# Patient Record
Sex: Male | Born: 1940 | Race: White | Hispanic: No | Marital: Married | Smoking: Never smoker
Health system: Southern US, Community
[De-identification: ages and names within clinical notes are randomized; demographics above are authoritative.]

## PROBLEM LIST (undated history)

## (undated) DIAGNOSIS — N200 Calculus of kidney: Secondary | ICD-10-CM

## (undated) DIAGNOSIS — K219 Gastro-esophageal reflux disease without esophagitis: Secondary | ICD-10-CM

## (undated) DIAGNOSIS — H9190 Unspecified hearing loss, unspecified ear: Secondary | ICD-10-CM

## (undated) DIAGNOSIS — Z86718 Personal history of other venous thrombosis and embolism: Secondary | ICD-10-CM

## (undated) DIAGNOSIS — R002 Palpitations: Secondary | ICD-10-CM

## (undated) DIAGNOSIS — Z973 Presence of spectacles and contact lenses: Secondary | ICD-10-CM

## (undated) HISTORY — PX: KIDNEY STONE SURGERY: SHX686

## (undated) HISTORY — PX: LITHOTRIPSY: SUR834

## (undated) HISTORY — PX: NASAL SINUS SURGERY: SHX719

## (undated) HISTORY — PX: UMBILICAL HERNIA REPAIR: SHX196

## (undated) HISTORY — PX: KNEE SURGERY: SHX244

---

## 2014-06-11 ENCOUNTER — Emergency Department (HOSPITAL_BASED_OUTPATIENT_CLINIC_OR_DEPARTMENT_OTHER)
Admission: EM | Admit: 2014-06-11 | Discharge: 2014-06-11 | Disposition: A | Discharge status: Home / Self Care | Payer: Medicare Other | Attending: Emergency Medicine | Admitting: Emergency Medicine

## 2014-06-11 ENCOUNTER — Emergency Department (HOSPITAL_BASED_OUTPATIENT_CLINIC_OR_DEPARTMENT_OTHER): Payer: Medicare Other

## 2014-06-11 ENCOUNTER — Encounter (HOSPITAL_BASED_OUTPATIENT_CLINIC_OR_DEPARTMENT_OTHER): Payer: Self-pay

## 2014-06-11 DIAGNOSIS — Z79899 Other long term (current) drug therapy: Secondary | ICD-10-CM | POA: Diagnosis not present

## 2014-06-11 DIAGNOSIS — I82409 Acute embolism and thrombosis of unspecified deep veins of unspecified lower extremity: Secondary | ICD-10-CM | POA: Insufficient documentation

## 2014-06-11 DIAGNOSIS — M25562 Pain in left knee: Secondary | ICD-10-CM | POA: Diagnosis not present

## 2014-06-11 DIAGNOSIS — Z87442 Personal history of urinary calculi: Secondary | ICD-10-CM | POA: Insufficient documentation

## 2014-06-11 DIAGNOSIS — I82402 Acute embolism and thrombosis of unspecified deep veins of left lower extremity: Secondary | ICD-10-CM

## 2014-06-11 DIAGNOSIS — M25569 Pain in unspecified knee: Secondary | ICD-10-CM

## 2014-06-11 DIAGNOSIS — K219 Gastro-esophageal reflux disease without esophagitis: Secondary | ICD-10-CM | POA: Diagnosis not present

## 2014-06-11 HISTORY — DX: Calculus of kidney: N20.0

## 2014-06-11 HISTORY — DX: Gastro-esophageal reflux disease without esophagitis: K21.9

## 2014-06-11 LAB — CBC WITH DIFFERENTIAL/PLATELET
Basophils Absolute: 0 10*3/uL (ref 0.0–0.1)
Basophils Relative: 0 % (ref 0–1)
EOS ABS: 0.2 10*3/uL (ref 0.0–0.7)
Eosinophils Relative: 2 % (ref 0–5)
HCT: 41.1 % (ref 39.0–52.0)
HEMOGLOBIN: 14 g/dL (ref 13.0–17.0)
Lymphocytes Relative: 15 % (ref 12–46)
Lymphs Abs: 1.4 10*3/uL (ref 0.7–4.0)
MCH: 30.2 pg (ref 26.0–34.0)
MCHC: 34.1 g/dL (ref 30.0–36.0)
MCV: 88.8 fL (ref 78.0–100.0)
MONOS PCT: 7 % (ref 3–12)
Monocytes Absolute: 0.6 10*3/uL (ref 0.1–1.0)
NEUTROS ABS: 6.7 10*3/uL (ref 1.7–7.7)
Neutrophils Relative %: 76 % (ref 43–77)
Platelets: 192 10*3/uL (ref 150–400)
RBC: 4.63 MIL/uL (ref 4.22–5.81)
RDW: 13.5 % (ref 11.5–15.5)
WBC: 8.9 10*3/uL (ref 4.0–10.5)

## 2014-06-11 LAB — COMPREHENSIVE METABOLIC PANEL
ALT: 14 U/L (ref 0–53)
ANION GAP: 13 (ref 5–15)
AST: 16 U/L (ref 0–37)
Albumin: 3.8 g/dL (ref 3.5–5.2)
Alkaline Phosphatase: 81 U/L (ref 39–117)
BILIRUBIN TOTAL: 0.6 mg/dL (ref 0.3–1.2)
BUN: 11 mg/dL (ref 6–23)
CHLORIDE: 101 meq/L (ref 96–112)
CO2: 26 mEq/L (ref 19–32)
CREATININE: 0.7 mg/dL (ref 0.50–1.35)
Calcium: 9.2 mg/dL (ref 8.4–10.5)
GFR calc Af Amer: >90 mL/min (ref >90)
GFR calc non Af Amer: >90 mL/min (ref >90)
Glucose, Bld: 105 mg/dL — ABNORMAL HIGH (ref 70–99)
Potassium: 4.3 mEq/L (ref 3.7–5.3)
Sodium: 140 mEq/L (ref 137–147)
Total Protein: 7.4 g/dL (ref 6.0–8.3)

## 2014-06-11 LAB — PROTIME-INR
INR: 0.97 (ref 0.00–1.49)
Prothrombin Time: 12.9 seconds (ref 11.6–15.2)

## 2014-06-11 MED ORDER — RIVAROXABAN 15 MG PO TABS
15.0000 mg | ORAL_TABLET | Freq: Once | ORAL | Status: AC
  Administered 2014-06-11: 15 mg via ORAL
  Filled 2014-06-11: qty 1

## 2014-06-11 MED ORDER — XARELTO VTE STARTER PACK 15 & 20 MG PO TBPK: 15.0000 mg | ORAL_TABLET | ORAL | Status: DC

## 2014-06-11 NOTE — ED Notes (Signed)
MD at bedside. 

## 2014-06-11 NOTE — ED Provider Notes (Signed)
CSN: [id number]     Arrival date & time 06/11/14  1820 History  This chart was scribed for Glynn OctaveStephen Samson Ralph, MD by Evon Slackerrance Branch, ED Scribe. This patient was seen in room MH06/MH06 and the patient's care was started at 6:44 PM.     Chief Complaint  Patient presents with  . Knee Pain   Patient is a 73 y.o. male presenting with knee pain. The history is provided by the patient. No language interpreter was used.  Knee Pain Associated symptoms: no fever    HPI Comments: Robert PiccoloWilliam Beard is a 73 y.o. male who presents to the Emergency Department complaining of worsening left knee pain onset tonight while watching TV. Pt states that he just had the meniscus reapaired on the left knee 2 days ago. He states that the pain and swelling have gradually worsened about 2 hours PTA. He states that the knee began to spasm and lasted for about 1 hour and 15 minutes. He describes the spasm as " hard, hot and painful." Daughter states that the leg feels more warm and slightly more swollen. He states that the knee has resolved back to normal swelling and pain from the surgery. He states he has taken oxycodone that has provided some relief to the pain. Pt states that he was not placed on strict restrictions and is able to bear weight and walk on the knee. Denies fever, vomiting, CP, SOB, abdominal pain.   Knee Surgeon Dr. Rolley SimsLangfitt    Past Medical History  Diagnosis Date  . GERD (gastroesophageal reflux disease)   . Kidney stone    Past Surgical History  Procedure Laterality Date  . Knee surgery    . Lithotripsy    . Kidney stone surgery     No family history on file. History  Substance Use Topics  . Smoking status: Never Smoker   . Smokeless tobacco: Not on file  . Alcohol Use: No    Review of Systems  Constitutional: Negative for fever.  Respiratory: Negative for shortness of breath.   Cardiovascular: Negative for chest pain.  Gastrointestinal: Negative for vomiting and abdominal pain.   Musculoskeletal: Positive for myalgias, arthralgias and gait problem.   A complete 10 system review of systems was obtained and all systems are negative except as noted in the HPI and PMH.     Allergies  Morphine and related  Home Medications   Prior to Admission medications   Medication Sig Start Date End Date Taking? Authorizing Provider  LANSOPRAZOLE PO Take by mouth.   Yes Historical Provider, MD  OXYCODONE HCL PO Take by mouth.   Yes Historical Provider, MD  XARELTO STARTER PACK 15 & 20 MG TBPK Take 15-20 mg by mouth as directed. Take as directed on package: Start with one 15mg  tablet by mouth twice a day with food. On Day 22, switch to one 20mg  tablet once a day with food. 06/11/14   Glynn OctaveStephen Malgorzata Albert, MD   Triage Vitals: BP 154/105 mmHg  Pulse 83  Temp(Src) 98.1 F (36.7 C) (Oral)  Resp 18  Ht 5\' 10"  (1.778 m)  Wt 210 lb (95.255 kg)  BMI 30.13 kg/m2  SpO2 99%  Physical Exam  Constitutional: He is oriented to person, place, and time. He appears well-developed and well-nourished. No distress.  HENT:  Head: Normocephalic and atraumatic.  Mouth/Throat: Oropharynx is clear and moist. No oropharyngeal exudate.  Eyes: Conjunctivae and EOM are normal. Pupils are equal, round, and reactive to light.  Neck: Normal range of motion.  Neck supple.  No meningismus.  Cardiovascular: Normal rate, regular rhythm, normal heart sounds and intact distal pulses.   No murmur heard. Pulmonary/Chest: Effort normal and breath sounds normal. No respiratory distress.  Abdominal: Soft. There is no tenderness. There is no rebound and no guarding.  Musculoskeletal: Normal range of motion. He exhibits edema and tenderness.  Left knee: moderate effusion, intact surgical incision x3, no cellulitis, flexion intact to about 60 degrees, extension intact, intact DT and DP pulses,  moderate calf swelling with trace erythema and compartment soft.    Neurological: He is alert and oriented to person, place,  and time. No cranial nerve deficit. He exhibits normal muscle tone. Coordination normal.  No ataxia on finger to nose bilaterally. No pronator drift. 5/5 strength throughout. CN 2-12 intact. Negative Romberg. Equal grip strength. Sensation intact. Gait is normal.   Skin: Skin is warm.  Psychiatric: He has a normal mood and affect. His behavior is normal.  Nursing note and vitals reviewed.   ED Course  Procedures (including critical care time) DIAGNOSTIC STUDIES: Oxygen Saturation is 99% on RA, normal by my interpretation.    COORDINATION OF CARE: 6:59 PM-Discussed treatment plan with pt at bedside and pt agreed to plan.     Labs Review Labs Reviewed  COMPREHENSIVE METABOLIC PANEL - Abnormal; Notable for the following:    Glucose, Bld 105 (*)    All other components within normal limits  CBC WITH DIFFERENTIAL  PROTIME-INR    Imaging Review [place] Venous Img Lower Unilateral Left  06/11/2014   CLINICAL DATA:  Left knee pain. Swelling and redness. Status post left meniscus repair 2 days ago.  EXAM: Left LOWER EXTREMITY VENOUS DOPPLER ULTRASOUND  TECHNIQUE: Gray-scale sonography with graded compression, as well as color Doppler and duplex ultrasound were performed to evaluate the lower extremity deep venous systems from the level of the common femoral vein and including the common femoral, femoral, profunda femoral, popliteal and calf veins including the posterior tibial, peroneal and gastrocnemius veins when visible. The superficial great saphenous vein was also interrogated. Spectral Doppler was utilized to evaluate flow at rest and with distal augmentation maneuvers in the common femoral, femoral and popliteal veins.  COMPARISON:  None.  FINDINGS: Left: Noncompressible, mildly expanded distal femoral vein and popliteal vein with absent color Doppler flow. Findings are consistent with thrombosis, essentially occlusive. Peroneal vein was not visualized, the posterior tibial vein is patent. The  mid and upper femoral vein, upper profunda femoral vein, saphenous femoral junction, and common femoral vein on the left are patent. Respiratory phasicity present.  Right:  The common femoral vein is normal.  IMPRESSION: Positive for deep venous thrombosis in the left distal femoral and popliteal veins.   Electronically Signed   By: Tiburcio PeaJonathan  Watts M.D.   On: 06/11/2014 20:56   Dg Knee Complete 4 Views Left  06/11/2014   CLINICAL DATA:  Pain and swelling status post recent arthroscopic surgery  EXAM: LEFT KNEE - COMPLETE 4+ VIEW  COMPARISON:  Left knee radiographs April 01, 2014 and left knee MRI May 13, 2014  FINDINGS: Frontal, lateral, and bilateral oblique views were obtained. There is a large joint effusion. No fracture or dislocation. Joint spaces appear intact. No erosive change.  IMPRESSION: Large joint effusion. No fracture or appreciable arthropathic change.   Electronically Signed   By: Bretta BangWilliam  Woodruff M.D.   On: 06/11/2014 19:41     EKG Interpretation None      MDM   Final diagnoses:  Knee pain  DVT (deep venous thrombosis), right  L knee and leg pain after arthoscopy 2 days ago.  No fever or vomiting. No erythema, able to flex and extend.  Doubt septic joint.  Large knee effusion on x-ray. Patient with flexion and extension intact. No overlying cellulitis. No fever. Doubt septic joint.  Discussed with on-call orthopedic surgeon Dr. Guilford Shi at [facility]. Who recommends patient follow up with his orthopedic surgeon tomorrow. Dr. Guilford Shi agrees no role for arthrocentesis in joint that is 2 days postoperative. Flexion and extension are intact. There is no fever.  Updated Dr. Guilford Shi of +DVT.  Patient denies SOB or Chest pain.  Dr. Guilford Shi states safe to start xarelto despite recent surgery. Cr and platelets normal.  D/w patient and family risks and benefits of xarelto. They are agreeable.  Starter pack given.  Follow up with Dr. Theodis Aguas tomorrow.  Return precautions  discussed.   I personally performed the services described in this documentation, which was scribed in my presence. The recorded information has been reviewed and is accurate.    Glynn Octave, MD 06/12/14 [phone]

## 2014-06-11 NOTE — Discharge Instructions (Signed)
Deep Vein Thrombosis Follow up with Dr. Rolley SimsLangfitt tomorrow. Return to the ED if you develop chest pain, SOB, or any other concerns. A deep vein thrombosis (DVT) is a blood clot that develops in the deep, larger veins of the leg, arm, or pelvis. These are more dangerous than clots that might form in veins near the surface of the body. A DVT can lead to serious and even life-threatening complications if the clot breaks off and travels in the bloodstream to the lungs.  A DVT can damage the valves in your leg veins so that instead of flowing upward, the blood pools in the lower leg. This is called post-thrombotic syndrome, and it can result in pain, swelling, discoloration, and sores on the leg. CAUSES Usually, several things contribute to the formation of blood clots. Contributing factors include:  The flow of blood slows down.  The inside of the vein is damaged in some way.  You have a condition that makes blood clot more easily. RISK FACTORS Some people are more likely than others to develop blood clots. Risk factors include:   Smoking.  Being overweight (obese).  Sitting or lying still for a long time. This includes long-distance travel, paralysis, or recovery from an illness or surgery. Other factors that increase risk are:   Older age, especially over 72 years of age.  Having a family history of blood clots or if you have already had a blot clot.  Having major or lengthy surgery. This is especially true for surgery on the hip, knee, or belly (abdomen). Hip surgery is particularly high risk.  Having a long, thin tube (catheter) placed inside a vein during a medical procedure.  Breaking a hip or leg.  Having cancer or cancer treatment.  Pregnancy and childbirth.  Hormone changes make the blood clot more easily during pregnancy.  The fetus puts pressure on the veins of the pelvis.  There is a risk of injury to veins during delivery or a caesarean delivery. The risk is highest  just after childbirth.  Medicines containing the male hormone estrogen. This includes birth control pills and hormone replacement therapy.  Other circulation or heart problems.  SIGNS AND SYMPTOMS When a clot forms, it can either partially or totally block the blood flow in that vein. Symptoms of a DVT can include:  Swelling of the leg or arm, especially if one side is much worse.  Warmth and redness of the leg or arm, especially if one side is much worse.  Pain in an arm or leg. If the clot is in the leg, symptoms may be more noticeable or worse when standing or walking. The symptoms of a DVT that has traveled to the lungs (pulmonary embolism, PE) usually start suddenly and include:  Shortness of breath.  Coughing.  Coughing up blood or blood-tinged mucus.  Chest pain. The chest pain is often worse with deep breaths.  Rapid heartbeat. Anyone with these symptoms should get emergency medical treatment right away. Do not wait to see if the symptoms will go away. Call your local emergency services (911 in the [place] if you have these symptoms. Do not drive yourself to the hospital. DIAGNOSIS If a DVT is suspected, your health care provider will take a full medical history and perform a physical exam. Tests that also may be required include:  Blood tests, including studies of the clotting properties of the blood.  Ultrasound to see if you have clots in your legs or lungs.  X-rays to show the  flow of blood when dye is injected into the veins (venogram).  Studies of your lungs if you have any chest symptoms. PREVENTION  Exercise the legs regularly. Take a brisk 30-minute walk every day.  Maintain a weight that is appropriate for your height.  Avoid sitting or lying in bed for long periods of time without moving your legs.  Women, particularly those over the age of 35 years, should consider the risks and benefits of taking estrogen medicines, including birth control pills.  Do  not smoke, especially if you take estrogen medicines.  Long-distance travel can increase your risk of DVT. You should exercise your legs by walking or pumping the muscles every hour.  Many of the risk factors above relate to situations that exist with hospitalization, either for illness, injury, or elective surgery. Prevention may include medical and nonmedical measures.  Your health care provider will assess you for the need for venous thromboembolism prevention when you are admitted to the hospital. If you are having surgery, your surgeon will assess you the day of or day after surgery. TREATMENT Once identified, a DVT can be treated. It can also be prevented in some circumstances. Once you have had a DVT, you may be at increased risk for a DVT in the future. The most common treatment for DVT is blood-thinning (anticoagulant) medicine, which reduces the blood's tendency to clot. Anticoagulants can stop new blood clots from forming and stop old clots from growing. They cannot dissolve existing clots. Your body does this by itself over time. Anticoagulants can be given by mouth, through an IV tube, or by injection. Your health care provider will determine the best program for you. Other medicines or treatments that may be used are:  Heparin or related medicines (low molecular weight heparin) are often the first treatment for a blood clot. They act quickly. However, they cannot be taken orally and must be given either in shot form or by IV tube.  Heparin can cause a fall in a component of blood that stops bleeding and forms blood clots (platelets). You will be monitored with blood tests to be sure this does not occur.  Warfarin is an anticoagulant that can be swallowed. It takes a few days to start working, so usually heparin or related medicines are used in combination. Once warfarin is working, heparin is usually stopped.  Factor Xa inhibitor medicines, such as rivaroxaban and apixaban, also reduce  blood clotting. These medicines are taken orally and can often be used without heparin or related medicines.  Less commonly, clot dissolving drugs (thrombolytics) are used to dissolve a DVT. They carry a high risk of bleeding, so they are used mainly in severe cases where your life or a part of your body is threatened.  Very rarely, a blood clot in the leg needs to be removed surgically.  If you are unable to take anticoagulants, your health care provider may arrange for you to have a filter placed in a main vein in your abdomen. This filter prevents clots from traveling to your lungs. HOME CARE INSTRUCTIONS  Take all medicines as directed by your health care provider.  Learn as much as you can about DVT.  Wear a medical alert bracelet or carry a medical alert card.  Ask your health care provider how soon you can go back to normal activities. It is important to stay active to prevent blood clots. If you are on anticoagulant medicine, avoid contact sports.  It is very important to exercise. This  is especially important while traveling, sitting, or standing for long periods of time. Exercise your legs by walking or by tightening and relaxing your leg muscles regularly. Take frequent walks.  You may need to wear compression stockings. These are tight elastic stockings that apply pressure to the lower legs. This pressure can help keep the blood in the legs from clotting. Taking Warfarin Warfarin is a daily medicine that is taken by mouth. Your health care provider will advise you on the length of treatment (usually 3-6 months, sometimes lifelong). If you take warfarin:  Understand how to take warfarin and foods that can affect how warfarin works in Public relations account executiveyour body.  Too much and too little warfarin are both dangerous. Too much warfarin increases the risk of bleeding. Too little warfarin continues to allow the risk for blood clots. Warfarin and Regular Blood Testing While taking warfarin, you will  need to have regular blood tests to measure your blood clotting time. These blood tests usually include both the prothrombin time (PT) and international normalized ratio (INR) tests. The PT and INR results allow your health care provider to adjust your dose of warfarin. It is very important that you have your PT and INR tested as often as directed by your health care provider.  Warfarin and Your Diet Avoid major changes in your diet, or notify your health care provider before changing your diet. Arrange a visit with a registered dietitian to answer your questions. Many foods, especially foods high in vitamin K, can interfere with warfarin and affect the PT and INR results. You should eat a consistent amount of foods high in vitamin K. Foods high in vitamin K include:   Spinach, kale, broccoli, cabbage, collard and turnip greens, Brussels sprouts, peas, cauliflower, seaweed, and parsley.  Beef and pork liver.  Green tea.  Soybean oil. Warfarin with Other Medicines Many medicines can interfere with warfarin and affect the PT and INR results. You must:  Tell your health care provider about any and all medicines, vitamins, and supplements you take, including aspirin and other over-the-counter anti-inflammatory medicines. Be especially cautious with aspirin and anti-inflammatory medicines. Ask your health care provider before taking these.  Do not take or discontinue any prescribed or over-the-counter medicine except on the advice of your health care provider or pharmacist. Warfarin Side Effects Warfarin can have side effects, such as easy bruising and difficulty stopping bleeding. Ask your health care provider or pharmacist about other side effects of warfarin. You will need to:  Hold pressure over cuts for longer than usual.  Notify your dentist and other health care providers that you are taking warfarin before you undergo any procedures where bleeding may occur. Warfarin with Alcohol and  Tobacco   Drinking alcohol frequently can increase the effect of warfarin, leading to excess bleeding. It is best to avoid alcoholic drinks or to consume only very small amounts while taking warfarin. Notify your health care provider if you change your alcohol intake.   Do not use any tobacco products including cigarettes, chewing tobacco, or electronic cigarettes. If you smoke, quit. Ask your health care provider for help with quitting smoking. Alternative Medicines to Warfarin: Factor Xa Inhibitor Medicines  These blood-thinning medicines are taken by mouth, usually for several weeks or longer. It is important to take the medicine every single day at the same time each day.  There are no regular blood tests required when using these medicines.  There are fewer food and drug interactions than with warfarin.  The  side effects of this class of medicine are similar to those of warfarin, including excessive bruising or bleeding. Ask your health care provider or pharmacist about other potential side effects. SEEK MEDICAL CARE IF:  You notice a rapid heartbeat.  You feel weaker or more tired than usual.  You feel faint.  You notice increased bruising.  You feel your symptoms are not getting better in the time expected.  You believe you are having side effects of medicine. SEEK IMMEDIATE MEDICAL CARE IF:  You have chest pain.  You have trouble breathing.  You have new or increased swelling or pain in one leg.  You cough up blood.  You notice blood in vomit, in a bowel movement, or in urine. MAKE SURE YOU:  Understand these instructions.  Will watch your condition.  Will get help right away if you are not doing well or get worse. Document Released: 07/03/2005 Document Revised: 11/17/2013 Document Reviewed: 03/10/2013 [facility] Patient Information 2015 [place], [place]. This information is not intended to replace advice given to you by your health care provider. Make sure you  discuss any questions you have with your health care provider.

## 2014-06-11 NOTE — ED Notes (Signed)
Pt returned to ED room in NAD.  Family at bedside.

## 2014-06-11 NOTE — ED Notes (Signed)
Left knee surgery 2 days ago-c/o increase pain and swelling this pm

## 2014-06-11 NOTE — ED Notes (Signed)
Patient transported to X-ray 

## 2014-06-11 NOTE — ED Notes (Addendum)
Per MD request Dr. Manus Gunningancour, paging [facility] for the ortho MD on call. Spoke with [facility] rep Bruce at [phone] stated that he would place the call

## 2014-06-11 NOTE — ED Notes (Signed)
Pt is s/p left meniscal repair on Tuesday and has been ambulating on walker.  States he started using a cane today, was sitting in recliner and developed moderate knee pain with swelling and warmth. Denies injury.

## 2015-08-17 ENCOUNTER — Ambulatory Visit: Payer: Self-pay | Admitting: Physician Assistant

## 2015-08-18 NOTE — Pre-Procedure Instructions (Signed)
    Robert Beard  08/18/2015     No Pharmacies Listed   Your procedure is scheduled on Wednesday, February 8th, 2017.  Report to [facility] Admitting at 6:30 A.M.  Call this number if you have problems the morning of surgery:  [phone]   Remember:  Do not eat food or drink liquids after midnight.  Take these medicines the morning of surgery with A SIP OF WATER: Lansoprazole (Prevacid), Clear Eyes if needed.  Stop taking: Aspiring, NSAIDS, Aleve, Naproxen, Ibuprofen, Advil, Motrin, BC's, Goody's, Fish oil, all herbal medications, all vitamins.     Do not wear jewelry.  Do not wear lotions, powders, or colognes.  You may NOT wear deodorant.  Men may shave face and neck.  Do not bring valuables to the hospital.  [facility] is not responsible for any belongings or valuables.  Contacts, dentures or bridgework may not be worn into surgery.  Leave your suitcase in the car.  After surgery it may be brought to your room.  For patients admitted to the hospital, discharge time will be determined by your treatment team.  Patients discharged the day of surgery will not be allowed to drive home.   Special instructions:  See attached.   Please read over the following fact sheets that you were given. Pain Booklet, Coughing and Deep Breathing, MRSA Information and Surgical Site Infection Prevention

## 2015-08-19 ENCOUNTER — Encounter (HOSPITAL_COMMUNITY)
Admission: RE | Admit: 2015-08-19 | Discharge: 2015-08-19 | Disposition: A | Discharge status: Home / Self Care | Payer: Medicare Other | Source: Ambulatory Visit | Attending: Orthopedic Surgery | Admitting: Orthopedic Surgery

## 2015-08-19 ENCOUNTER — Encounter (HOSPITAL_COMMUNITY): Payer: Self-pay

## 2015-08-19 DIAGNOSIS — Z01812 Encounter for preprocedural laboratory examination: Secondary | ICD-10-CM | POA: Insufficient documentation

## 2015-08-19 DIAGNOSIS — M4806 Spinal stenosis, lumbar region: Secondary | ICD-10-CM | POA: Insufficient documentation

## 2015-08-19 HISTORY — DX: Unspecified hearing loss, unspecified ear: H91.90

## 2015-08-19 HISTORY — DX: Palpitations: R00.2

## 2015-08-19 HISTORY — DX: Presence of spectacles and contact lenses: Z97.3

## 2015-08-19 HISTORY — DX: Personal history of other venous thrombosis and embolism: Z86.718

## 2015-08-19 LAB — SURGICAL PCR SCREEN
MRSA, PCR: NEGATIVE
Staphylococcus aureus: NEGATIVE

## 2015-08-19 LAB — BASIC METABOLIC PANEL
Anion gap: 12 (ref 5–15)
BUN: 14 mg/dL (ref 6–20)
CHLORIDE: 105 mmol/L (ref 101–111)
CO2: 23 mmol/L (ref 22–32)
CREATININE: 0.72 mg/dL (ref 0.61–1.24)
Calcium: 10.1 mg/dL (ref 8.9–10.3)
GFR calc Af Amer: >60 mL/min (ref >60)
GFR calc non Af Amer: >60 mL/min (ref >60)
Glucose, Bld: 87 mg/dL (ref 65–99)
Potassium: 4.3 mmol/L (ref 3.5–5.1)
SODIUM: 140 mmol/L (ref 135–145)

## 2015-08-19 LAB — CBC
HCT: 47.4 % (ref 39.0–52.0)
Hemoglobin: 16.5 g/dL (ref 13.0–17.0)
MCH: 30.7 pg (ref 26.0–34.0)
MCHC: 34.8 g/dL (ref 30.0–36.0)
MCV: 88.3 fL (ref 78.0–100.0)
PLATELETS: 199 10*3/uL (ref 150–400)
RBC: 5.37 MIL/uL (ref 4.22–5.81)
RDW: 13.4 % (ref 11.5–15.5)
WBC: 6.3 10*3/uL (ref 4.0–10.5)

## 2015-08-19 NOTE — Progress Notes (Signed)
PCP - Dr. Montey Hora Cardiologist - denies  EKG- 08/16/15 - requested from [facility] - denies  Echo/Stress test/Cardiac cath - denies  Patient denies chest pain and shortness of breath at PAT appointment.   Patient states that after his knee surgery, he developed a DVT and was put on Xarelto for 3 months but that was completed over a year ago.

## 2015-08-24 MED ORDER — CEFAZOLIN SODIUM-DEXTROSE 2-3 GM-% IV SOLR
2.0000 g | INTRAVENOUS | Status: AC
  Administered 2015-08-25: 2 g via INTRAVENOUS
  Filled 2015-08-24: qty 50

## 2015-08-25 ENCOUNTER — Inpatient Hospital Stay (HOSPITAL_COMMUNITY): Payer: Medicare Other

## 2015-08-25 ENCOUNTER — Inpatient Hospital Stay (HOSPITAL_COMMUNITY): Payer: Medicare Other | Admitting: Certified Registered Nurse Anesthetist

## 2015-08-25 ENCOUNTER — Inpatient Hospital Stay (HOSPITAL_COMMUNITY)
Admission: RE | Admit: 2015-08-25 | Discharge: 2015-08-26 | DRG: 517 | Disposition: A | Discharge status: Home / Self Care | Payer: Medicare Other | Source: Ambulatory Visit | Attending: Orthopedic Surgery | Admitting: Orthopedic Surgery

## 2015-08-25 ENCOUNTER — Encounter (HOSPITAL_COMMUNITY): Payer: Self-pay | Admitting: Certified Registered Nurse Anesthetist

## 2015-08-25 ENCOUNTER — Encounter (HOSPITAL_COMMUNITY)
Admission: RE | Disposition: A | Discharge status: Home / Self Care | Payer: Self-pay | Source: Ambulatory Visit | Attending: Orthopedic Surgery

## 2015-08-25 DIAGNOSIS — M5441 Lumbago with sciatica, right side: Secondary | ICD-10-CM | POA: Diagnosis present

## 2015-08-25 DIAGNOSIS — K219 Gastro-esophageal reflux disease without esophagitis: Secondary | ICD-10-CM | POA: Diagnosis present

## 2015-08-25 DIAGNOSIS — H9190 Unspecified hearing loss, unspecified ear: Secondary | ICD-10-CM | POA: Diagnosis present

## 2015-08-25 DIAGNOSIS — M4800 Spinal stenosis, site unspecified: Secondary | ICD-10-CM | POA: Diagnosis present

## 2015-08-25 DIAGNOSIS — M4806 Spinal stenosis, lumbar region: Secondary | ICD-10-CM | POA: Diagnosis present

## 2015-08-25 DIAGNOSIS — Z419 Encounter for procedure for purposes other than remedying health state, unspecified: Secondary | ICD-10-CM

## 2015-08-25 DIAGNOSIS — M5442 Lumbago with sciatica, left side: Secondary | ICD-10-CM | POA: Diagnosis present

## 2015-08-25 DIAGNOSIS — Z885 Allergy status to narcotic agent status: Secondary | ICD-10-CM

## 2015-08-25 HISTORY — PX: DECOMPRESSIVE LUMBAR LAMINECTOMY LEVEL 2: SHX5792

## 2015-08-25 SURGERY — DECOMPRESSIVE LUMBAR LAMINECTOMY LEVEL 2
Anesthesia: General | Site: Back

## 2015-08-25 MED ORDER — LIDOCAINE HCL (CARDIAC) 20 MG/ML IV SOLN
INTRAVENOUS | Status: DC | PRN
  Administered 2015-08-25: 80 mg via INTRAVENOUS

## 2015-08-25 MED ORDER — ACETAMINOPHEN 10 MG/ML IV SOLN
INTRAVENOUS | Status: AC
  Filled 2015-08-25: qty 100

## 2015-08-25 MED ORDER — ONDANSETRON HCL 4 MG/2ML IJ SOLN: 4.0000 mg | INTRAMUSCULAR | Status: DC | PRN

## 2015-08-25 MED ORDER — PROPOFOL 10 MG/ML IV BOLUS
INTRAVENOUS | Status: DC | PRN
  Administered 2015-08-25: 170 mg via INTRAVENOUS

## 2015-08-25 MED ORDER — MAGNESIUM CITRATE PO SOLN
0.5000 | Freq: Once | ORAL | Status: AC
  Administered 2015-08-26: 0.5 via ORAL
  Filled 2015-08-25: qty 296

## 2015-08-25 MED ORDER — MORPHINE SULFATE (PF) 2 MG/ML IV SOLN: 1.0000 mg | INTRAVENOUS | Status: DC | PRN

## 2015-08-25 MED ORDER — HYDROMORPHONE HCL 1 MG/ML IJ SOLN
INTRAMUSCULAR | Status: AC
  Filled 2015-08-25: qty 1

## 2015-08-25 MED ORDER — FENTANYL CITRATE (PF) 100 MCG/2ML IJ SOLN
INTRAMUSCULAR | Status: AC
Start: 2015-08-25 — End: 2015-08-26
  Filled 2015-08-25: qty 2

## 2015-08-25 MED ORDER — ARTIFICIAL TEARS OP OINT
TOPICAL_OINTMENT | OPHTHALMIC | Status: DC | PRN
  Administered 2015-08-25: 1 via OPHTHALMIC

## 2015-08-25 MED ORDER — OXYCODONE HCL 5 MG PO TABS
10.0000 mg | ORAL_TABLET | ORAL | Status: DC | PRN
  Administered 2015-08-25 – 2015-08-26 (×5): 10 mg via ORAL
  Filled 2015-08-25 (×6): qty 2

## 2015-08-25 MED ORDER — FLEET ENEMA 7-19 GM/118ML RE ENEM: 1.0000 | ENEMA | Freq: Once | RECTAL | Status: DC

## 2015-08-25 MED ORDER — SODIUM CHLORIDE 0.9% FLUSH
3.0000 mL | Freq: Two times a day (BID) | INTRAVENOUS | Status: DC
  Administered 2015-08-25: 3 mL via INTRAVENOUS

## 2015-08-25 MED ORDER — CEFAZOLIN SODIUM 1-5 GM-% IV SOLN
1.0000 g | Freq: Three times a day (TID) | INTRAVENOUS | Status: AC
  Administered 2015-08-25 (×2): 1 g via INTRAVENOUS
  Filled 2015-08-25 (×2): qty 50

## 2015-08-25 MED ORDER — PROPOFOL 10 MG/ML IV BOLUS
INTRAVENOUS | Status: AC
  Filled 2015-08-25: qty 20

## 2015-08-25 MED ORDER — THROMBIN 20000 UNITS EX KIT
PACK | CUTANEOUS | Status: DC | PRN
  Administered 2015-08-25: 20 mL via TOPICAL

## 2015-08-25 MED ORDER — THROMBIN 20000 UNITS EX SOLR
CUTANEOUS | Status: AC
  Filled 2015-08-25: qty 20000

## 2015-08-25 MED ORDER — DEXAMETHASONE SODIUM PHOSPHATE 4 MG/ML IJ SOLN
4.0000 mg | Freq: Four times a day (QID) | INTRAMUSCULAR | Status: DC
  Administered 2015-08-25: 4 mg via INTRAVENOUS
  Filled 2015-08-25: qty 1

## 2015-08-25 MED ORDER — DEXAMETHASONE SODIUM PHOSPHATE 10 MG/ML IJ SOLN
INTRAMUSCULAR | Status: AC
  Filled 2015-08-25: qty 1

## 2015-08-25 MED ORDER — ACETAMINOPHEN 10 MG/ML IV SOLN
INTRAVENOUS | Status: DC | PRN
  Administered 2015-08-25: 1000 mg via INTRAVENOUS

## 2015-08-25 MED ORDER — FENTANYL CITRATE (PF) 100 MCG/2ML IJ SOLN
INTRAMUSCULAR | Status: DC | PRN
  Administered 2015-08-25: 50 ug via INTRAVENOUS
  Administered 2015-08-25: 150 ug via INTRAVENOUS
  Administered 2015-08-25: 50 ug via INTRAVENOUS

## 2015-08-25 MED ORDER — PHENOL 1.4 % MT LIQD: 1.0000 | OROMUCOSAL | Status: DC | PRN

## 2015-08-25 MED ORDER — DEXAMETHASONE SODIUM PHOSPHATE 10 MG/ML IJ SOLN
INTRAMUSCULAR | Status: DC | PRN
  Administered 2015-08-25: 10 mg via INTRAVENOUS

## 2015-08-25 MED ORDER — PROMETHAZINE HCL 25 MG/ML IJ SOLN: 6.2500 mg | INTRAMUSCULAR | Status: DC | PRN

## 2015-08-25 MED ORDER — VITAMIN D3 25 MCG (1000 UNIT) PO TABS
2000.0000 [IU] | ORAL_TABLET | Freq: Every day | ORAL | Status: DC
  Administered 2015-08-25 – 2015-08-26 (×2): 2000 [IU] via ORAL
  Filled 2015-08-25 (×4): qty 2

## 2015-08-25 MED ORDER — MIDAZOLAM HCL 2 MG/2ML IJ SOLN
INTRAMUSCULAR | Status: AC
  Filled 2015-08-25: qty 2

## 2015-08-25 MED ORDER — BUPIVACAINE-EPINEPHRINE (PF) 0.25% -1:200000 IJ SOLN
INTRAMUSCULAR | Status: AC
  Filled 2015-08-25: qty 30

## 2015-08-25 MED ORDER — SUGAMMADEX SODIUM 200 MG/2ML IV SOLN
INTRAVENOUS | Status: AC
  Filled 2015-08-25: qty 2

## 2015-08-25 MED ORDER — EPHEDRINE SULFATE 50 MG/ML IJ SOLN
INTRAMUSCULAR | Status: AC
  Filled 2015-08-25: qty 1

## 2015-08-25 MED ORDER — PHENYLEPHRINE 40 MCG/ML (10ML) SYRINGE FOR IV PUSH (FOR BLOOD PRESSURE SUPPORT)
PREFILLED_SYRINGE | INTRAVENOUS | Status: AC
  Filled 2015-08-25: qty 10

## 2015-08-25 MED ORDER — ONDANSETRON HCL 4 MG/2ML IJ SOLN
INTRAMUSCULAR | Status: DC | PRN
  Administered 2015-08-25: 4 mg via INTRAVENOUS

## 2015-08-25 MED ORDER — ROCURONIUM BROMIDE 50 MG/5ML IV SOLN
INTRAVENOUS | Status: AC
  Filled 2015-08-25: qty 1

## 2015-08-25 MED ORDER — SODIUM CHLORIDE 0.9% FLUSH: 3.0000 mL | INTRAVENOUS | Status: DC | PRN

## 2015-08-25 MED ORDER — FENTANYL CITRATE (PF) 250 MCG/5ML IJ SOLN
INTRAMUSCULAR | Status: AC
  Filled 2015-08-25: qty 5

## 2015-08-25 MED ORDER — ROCURONIUM BROMIDE 100 MG/10ML IV SOLN
INTRAVENOUS | Status: DC | PRN
  Administered 2015-08-25: 50 mg via INTRAVENOUS
  Administered 2015-08-25 (×2): 10 mg via INTRAVENOUS

## 2015-08-25 MED ORDER — HYDROMORPHONE HCL 1 MG/ML IJ SOLN
0.5000 mg | INTRAMUSCULAR | Status: DC | PRN
  Administered 2015-08-25: 1 mg via INTRAVENOUS

## 2015-08-25 MED ORDER — METHOCARBAMOL 500 MG PO TABS: 500.0000 mg | ORAL_TABLET | Freq: Three times a day (TID) | ORAL | Status: AC | PRN

## 2015-08-25 MED ORDER — LACTATED RINGERS IV SOLN: INTRAVENOUS | Status: DC

## 2015-08-25 MED ORDER — EPHEDRINE SULFATE 50 MG/ML IJ SOLN
INTRAMUSCULAR | Status: DC | PRN
  Administered 2015-08-25 (×3): 5 mg via INTRAVENOUS
  Administered 2015-08-25: 10 mg via INTRAVENOUS
  Administered 2015-08-25 (×2): 5 mg via INTRAVENOUS

## 2015-08-25 MED ORDER — DEXAMETHASONE 4 MG PO TABS
4.0000 mg | ORAL_TABLET | Freq: Four times a day (QID) | ORAL | Status: DC
  Administered 2015-08-25 – 2015-08-26 (×3): 4 mg via ORAL
  Filled 2015-08-25 (×3): qty 1

## 2015-08-25 MED ORDER — LACTATED RINGERS IV SOLN
INTRAVENOUS | Status: DC | PRN
  Administered 2015-08-25 (×2): via INTRAVENOUS

## 2015-08-25 MED ORDER — 0.9 % SODIUM CHLORIDE (POUR BTL) OPTIME
TOPICAL | Status: DC | PRN
  Administered 2015-08-25: 1000 mL

## 2015-08-25 MED ORDER — SUCCINYLCHOLINE CHLORIDE 20 MG/ML IJ SOLN
INTRAMUSCULAR | Status: AC
  Filled 2015-08-25: qty 1

## 2015-08-25 MED ORDER — FENTANYL CITRATE (PF) 100 MCG/2ML IJ SOLN
25.0000 ug | INTRAMUSCULAR | Status: DC | PRN
  Administered 2015-08-25 (×3): 50 ug via INTRAVENOUS

## 2015-08-25 MED ORDER — METHOCARBAMOL 1000 MG/10ML IJ SOLN
500.0000 mg | Freq: Four times a day (QID) | INTRAVENOUS | Status: DC | PRN
  Filled 2015-08-25: qty 5

## 2015-08-25 MED ORDER — ONDANSETRON HCL 4 MG/2ML IJ SOLN
INTRAMUSCULAR | Status: AC
  Filled 2015-08-25: qty 2

## 2015-08-25 MED ORDER — FENTANYL CITRATE (PF) 100 MCG/2ML IJ SOLN
INTRAMUSCULAR | Status: AC
  Filled 2015-08-25: qty 2

## 2015-08-25 MED ORDER — GLYCOPYRROLATE 0.2 MG/ML IJ SOLN
INTRAMUSCULAR | Status: DC | PRN
  Administered 2015-08-25: 0.1 mg via INTRAVENOUS

## 2015-08-25 MED ORDER — ONDANSETRON HCL 4 MG PO TABS: 4.0000 mg | ORAL_TABLET | Freq: Three times a day (TID) | ORAL | Status: AC | PRN

## 2015-08-25 MED ORDER — METHOCARBAMOL 500 MG PO TABS
500.0000 mg | ORAL_TABLET | Freq: Four times a day (QID) | ORAL | Status: DC | PRN
  Administered 2015-08-25 – 2015-08-26 (×2): 500 mg via ORAL
  Filled 2015-08-25 (×2): qty 1

## 2015-08-25 MED ORDER — OXYCODONE-ACETAMINOPHEN 10-325 MG PO TABS: 1.0000 | ORAL_TABLET | ORAL | Status: AC | PRN

## 2015-08-25 MED ORDER — MENTHOL 3 MG MT LOZG: 1.0000 | LOZENGE | OROMUCOSAL | Status: DC | PRN

## 2015-08-25 MED ORDER — MIDAZOLAM HCL 5 MG/5ML IJ SOLN
INTRAMUSCULAR | Status: DC | PRN
  Administered 2015-08-25 (×2): 1 mg via INTRAVENOUS

## 2015-08-25 MED ORDER — LIDOCAINE HCL (CARDIAC) 20 MG/ML IV SOLN
INTRAVENOUS | Status: AC
  Filled 2015-08-25: qty 5

## 2015-08-25 MED ORDER — HEMOSTATIC AGENTS (NO CHARGE) OPTIME
TOPICAL | Status: DC | PRN
  Administered 2015-08-25: 1 via TOPICAL

## 2015-08-25 MED ORDER — BUPIVACAINE-EPINEPHRINE (PF) 0.25% -1:200000 IJ SOLN
INTRAMUSCULAR | Status: DC | PRN
  Administered 2015-08-25: 30 mL via PERINEURAL

## 2015-08-25 MED ORDER — SUGAMMADEX SODIUM 200 MG/2ML IV SOLN
INTRAVENOUS | Status: DC | PRN
  Administered 2015-08-25: 200 mg via INTRAVENOUS

## 2015-08-25 SURGICAL SUPPLY — 66 items
BNDG GAUZE ELAST 4 BULKY (GAUZE/BANDAGES/DRESSINGS) ×3 IMPLANT
BUR EGG ELITE 4.0 (BURR) IMPLANT
BUR EGG ELITE 4.0MM (BURR)
CLOSURE STERI-STRIP 1/2X4 (GAUZE/BANDAGES/DRESSINGS) ×1
CLSR STERI-STRIP ANTIMIC 1/2X4 (GAUZE/BANDAGES/DRESSINGS) ×2 IMPLANT
COVER SURGICAL LIGHT HANDLE (MISCELLANEOUS) ×3 IMPLANT
DRAIN WOUND SNY 15 RND (WOUND CARE) ×3 IMPLANT
DRAPE C-ARM 42X72 X-RAY (DRAPES) IMPLANT
DRAPE POUCH INSTRU U-SHP 10X18 (DRAPES) ×3 IMPLANT
DRAPE SURG 17X11 SM STRL (DRAPES) ×3 IMPLANT
DRAPE U-SHAPE 47X51 STRL (DRAPES) ×3 IMPLANT
DRSG MEPILEX BORDER 4X4 (GAUZE/BANDAGES/DRESSINGS) ×3 IMPLANT
DRSG MEPILEX BORDER 4X8 (GAUZE/BANDAGES/DRESSINGS) ×3 IMPLANT
DURAPREP 26ML APPLICATOR (WOUND CARE) ×3 IMPLANT
ELECT BLADE 4.0 EZ CLEAN MEGAD (MISCELLANEOUS) ×3
ELECT CAUTERY BLADE 6.4 (BLADE) ×3 IMPLANT
ELECT PENCIL ROCKER SW 15FT (MISCELLANEOUS) ×3 IMPLANT
ELECT REM PT RETURN 9FT ADLT (ELECTROSURGICAL) ×3
ELECTRODE BLDE 4.0 EZ CLN MEGD (MISCELLANEOUS) ×1 IMPLANT
ELECTRODE REM PT RTRN 9FT ADLT (ELECTROSURGICAL) ×1 IMPLANT
EVACUATOR SILICONE 100CC (DRAIN) ×3 IMPLANT
FLOSEAL (HEMOSTASIS) ×3 IMPLANT
GLOVE BIO SURGEON STRL SZ7 (GLOVE) ×3 IMPLANT
GLOVE BIOGEL M 6.5 STRL (GLOVE) ×9 IMPLANT
GLOVE BIOGEL M 7.0 STRL (GLOVE) ×3 IMPLANT
GLOVE BIOGEL PI IND STRL 6.5 (GLOVE) ×1 IMPLANT
GLOVE BIOGEL PI IND STRL 7.0 (GLOVE) ×1 IMPLANT
GLOVE BIOGEL PI IND STRL 8.5 (GLOVE) ×1 IMPLANT
GLOVE BIOGEL PI INDICATOR 6.5 (GLOVE) ×2
GLOVE BIOGEL PI INDICATOR 7.0 (GLOVE) ×2
GLOVE BIOGEL PI INDICATOR 8.5 (GLOVE) ×2
GLOVE SS BIOGEL STRL SZ 8.5 (GLOVE) ×1 IMPLANT
GLOVE SUPERSENSE BIOGEL SZ 8.5 (GLOVE) ×2
GLOVE SURG SS PI 7.0 STRL IVOR (GLOVE) ×9 IMPLANT
GOWN STRL REUS W/ TWL LRG LVL3 (GOWN DISPOSABLE) ×3 IMPLANT
GOWN STRL REUS W/TWL 2XL LVL3 (GOWN DISPOSABLE) ×3 IMPLANT
GOWN STRL REUS W/TWL LRG LVL3 (GOWN DISPOSABLE) ×6
KIT BASIN OR (CUSTOM PROCEDURE TRAY) ×3 IMPLANT
NEEDLE 22X1 1/2 (OR ONLY) (NEEDLE) ×3 IMPLANT
NEEDLE SPNL 18GX3.5 QUINCKE PK (NEEDLE) ×6 IMPLANT
NS IRRIG 1000ML POUR BTL (IV SOLUTION) ×3 IMPLANT
PACK LAMINECTOMY ORTHO (CUSTOM PROCEDURE TRAY) ×3 IMPLANT
PACK UNIVERSAL I (CUSTOM PROCEDURE TRAY) ×3 IMPLANT
PATTIES SURGICAL .5 X.5 (GAUZE/BANDAGES/DRESSINGS) IMPLANT
PATTIES SURGICAL .5 X1 (DISPOSABLE) ×6 IMPLANT
SPONGE LAP 4X18 X RAY DECT (DISPOSABLE) IMPLANT
SPONGE SURGIFOAM ABS GEL 100 (HEMOSTASIS) ×3 IMPLANT
STAPLER VISISTAT 35W (STAPLE) IMPLANT
SURGIFLO W/THROMBIN 8M KIT (HEMOSTASIS) ×3 IMPLANT
SUT BONE WAX W31G (SUTURE) ×3 IMPLANT
SUT HI-FI 2 STRAND C-2 40 (SUTURE) ×3 IMPLANT
SUT MON AB 3-0 SH 27 (SUTURE) ×2
SUT MON AB 3-0 SH27 (SUTURE) ×1 IMPLANT
SUT STRATAFIX 0 PDS 27 VIOLET (SUTURE) ×3
SUT STRATAFIX 1PDS 45CM VIOLET (SUTURE) ×3 IMPLANT
SUT VIC AB 1 CT1 27 (SUTURE) ×4
SUT VIC AB 1 CT1 27XBRD ANTBC (SUTURE) ×2 IMPLANT
SUT VIC AB 2-0 CT1 18 (SUTURE) ×6 IMPLANT
SUT VICRYL 0 UR6 27IN ABS (SUTURE) IMPLANT
SUTURE STRATFX 0 PDS 27 VIOLET (SUTURE) ×1 IMPLANT
SYR BULB IRRIGATION 50ML (SYRINGE) ×3 IMPLANT
SYR CONTROL 10ML LL (SYRINGE) ×6 IMPLANT
TOWEL OR 17X26 10 PK STRL BLUE (TOWEL DISPOSABLE) ×6 IMPLANT
TRAY FOLEY CATH 16FRSI W/METER (SET/KITS/TRAYS/PACK) ×3 IMPLANT
WATER STERILE IRR 1000ML POUR (IV SOLUTION) IMPLANT
YANKAUER SUCT BULB TIP NO VENT (SUCTIONS) ×3 IMPLANT

## 2015-08-25 NOTE — Anesthesia Postprocedure Evaluation (Signed)
Anesthesia Post Note  Patient: Robert Beard  Procedure(s) Performed: Procedure(s) (LRB): L3-L5 DECOMPRESSION   (LEVEL 2) (N/A)  Patient location during evaluation: PACU Anesthesia Type: General Level of consciousness: awake Pain management: pain level controlled Vital Signs Assessment: post-procedure vital signs reviewed and stable Respiratory status: spontaneous breathing Cardiovascular status: stable Anesthetic complications: no    Last Vitals:  Filed Vitals:   08/25/15 1230 08/25/15 1256  BP: 143/74 138/74  Pulse: 87 89  Temp: 36.9 C 36.3 C  Resp: 21 16    Last Pain:  Filed Vitals:   08/25/15 1417  PainSc: 7                  EDWARDS,Jazlene Bares

## 2015-08-25 NOTE — Anesthesia Preprocedure Evaluation (Signed)
Anesthesia Evaluation  Patient identified by MRN, date of birth, ID band Patient awake    Reviewed: Allergy & Precautions, NPO status , Patient's Chart, lab work & pertinent test results  Airway Mallampati: II  TM Distance: >3 FB Neck ROM: Full    Dental   Pulmonary neg pulmonary ROS,    breath sounds clear to auscultation       Cardiovascular negative cardio ROS   Rhythm:Regular Rate:Normal     Neuro/Psych    GI/Hepatic Neg liver ROS, GERD  ,  Endo/Other  negative endocrine ROS  Renal/GU Renal disease     Musculoskeletal   Abdominal   Peds  Hematology   Anesthesia Other Findings   Reproductive/Obstetrics                             Anesthesia Physical Anesthesia Plan  ASA: III  Anesthesia Plan: General   Post-op Pain Management:    Induction: Intravenous  Airway Management Planned: Oral ETT  Additional Equipment:   Intra-op Plan:   Post-operative Plan: Extubation in OR  Informed Consent: I have reviewed the patients History and Physical, chart, labs and discussed the procedure including the risks, benefits and alternatives for the proposed anesthesia with the patient or authorized representative who has indicated his/her understanding and acceptance.   Dental advisory given  Plan Discussed with: CRNA and Anesthesiologist  Anesthesia Plan Comments:         Anesthesia Quick Evaluation

## 2015-08-25 NOTE — Evaluation (Signed)
Physical Therapy Evaluation Patient Details Name: Robert Beard MRN: [id number] DOB: 28-Jul-1940 Today's Date: 08/25/2015   History of Present Illness  Pt is a 75 y/o M s/p L3-5 decompression.  Pt's PMH includes blood clots, knee surgery.  Clinical Impression  Patient is s/p above surgery resulting in functional limitations due to the deficits listed below (see PT Problem List). Robert Beard is ambulating at a min guard level of assist.  LOB x1 standing at bedside, requiring min assist to steady.  He has a very supportive wife and daughter and will have 24/7 assist/supervision available at d/c.  Reviewed all back precautions and pt was able to recall all but no arching.  Patient will benefit from skilled PT to increase their independence and safety with mobility to allow discharge to the venue listed below.      Follow Up Recommendations No PT follow up;Supervision for mobility/OOB    Equipment Recommendations  None recommended by PT    Recommendations for Other Services       Precautions / Restrictions Precautions Precautions: Fall;Back Precaution Booklet Issued: Yes (comment) Precaution Comments: Reviewed back precautions w/ pt and family Required Braces or Orthoses: Spinal Brace Spinal Brace: Lumbar corset;Applied in sitting position Restrictions Weight Bearing Restrictions: No      Mobility  Bed Mobility Overal bed mobility: Needs Assistance Bed Mobility: Rolling;Sidelying to Sit;Sit to Sidelying Rolling: Min guard Sidelying to sit: Min guard;HOB elevated     Sit to sidelying: Min guard General bed mobility comments: Cues for log roll technique.  No physical assist needed.  Transfers Overall transfer level: Needs assistance Equipment used: None Transfers: Sit to/from Stand Sit to Stand: Min guard         General transfer comment: Pt w/ safe technique and very min instability noted.  Pt denies dizziness.    Ambulation/Gait Ambulation/Gait assistance: Min  guard Ambulation Distance (Feet): 300 Feet Assistive device: None Gait Pattern/deviations: Step-through pattern;Decreased stride length   Gait velocity interpretation: Below normal speed for age/gender General Gait Details: Pt initially reaching out for railing but is able to ambulate at min guard assist level w/o AD.    Stairs            Wheelchair Mobility    Modified Rankin (Stroke Patients Only)       Balance Overall balance assessment: Needs assistance Sitting-balance support: Bilateral upper extremity supported;Feet supported Sitting balance-Robert Beard Scale: Good     Standing balance support: No upper extremity supported;During functional activity Standing balance-Robert Beard Scale: Poor Standing balance comment: LOB x1 standing EOB when turning head to look at therapist, requiring min assist to steady.              High level balance activites: Head turns High Level Balance Comments: No instability noted w/ head turns             Pertinent Vitals/Pain Pain Assessment: 0-10 Pain Score: 6  (improves to 5/10 w/ ambulation) Pain Location: back Pain Descriptors / Indicators: Aching;Constant Pain Intervention(s): Limited activity within patient's tolerance;Monitored during session    Home Living Family/patient expects to be discharged to:: Private residence Living Arrangements: Spouse/significant other Available Help at Discharge: Family;Available 24 hours/day Type of Home: House Home Access: Level entry     Home Layout: One level Home Equipment: Walker - 2 wheels;Cane - single point      Prior Function Level of Independence: Independent               Hand Dominance  Extremity/Trunk Assessment   Upper Extremity Assessment: Overall WFL for tasks assessed           Lower Extremity Assessment: LLE deficits/detail;RLE deficits/detail      Cervical / Trunk Assessment: Other exceptions  Communication   Communication: No difficulties   Cognition Arousal/Alertness: Awake/alert Behavior During Therapy: WFL for tasks assessed/performed Overall Cognitive Status: Within Functional Limits for tasks assessed                      General Comments      Exercises        Assessment/Plan    PT Assessment Patient needs continued PT services  PT Diagnosis Difficulty walking;Acute pain   PT Problem List Decreased activity tolerance;Decreased balance;Decreased safety awareness;Decreased knowledge of precautions;Impaired sensation;Pain  PT Treatment Interventions DME instruction;Gait training;Functional mobility training;Therapeutic activities;Therapeutic exercise;Balance training;Neuromuscular re-education;Patient/family education;Modalities   PT Goals (Current goals can be found in the Care Plan section) Acute Rehab PT Goals Patient Stated Goal: to go home and to eventually get back to hiking PT Goal Formulation: With patient/family Time For Goal Achievement: 09/01/15 Potential to Achieve Goals: Good    Frequency Min 5X/week   Barriers to discharge        Co-evaluation               End of Session Equipment Utilized During Treatment: Back brace Activity Tolerance: Patient tolerated treatment well Patient left: in bed;with call bell/phone within reach;with family/visitor present;with SCD's reapplied Nurse Communication: Mobility status;Precautions         Time: 6962-9528 PT Time Calculation (min) (ACUTE ONLY): 25 min   Charges:   PT Evaluation $PT Eval Low Complexity: 1 Procedure PT Treatments $Gait Training: 8-22 mins   PT G Codes:       Michail Jewels PT, DPT [phone] Pager: [phone] 08/25/2015, 5:02 PM

## 2015-08-25 NOTE — Anesthesia Procedure Notes (Signed)
Procedure Name: Intubation Date/Time: 08/25/2015 8:33 AM Performed by: Little Ishikawa L Pre-anesthesia Checklist: Patient identified, Timeout performed, Emergency Drugs available, Suction available and Patient being monitored Patient Re-evaluated:Patient Re-evaluated prior to inductionOxygen Delivery Method: Circle system utilized Preoxygenation: Pre-oxygenation with 100% oxygen Intubation Type: IV induction Ventilation: Mask ventilation without difficulty Laryngoscope Size: Mac and 4 Grade View: Grade I Tube type: Oral Tube size: 7.5 mm Number of attempts: 1 Airway Equipment and Method: Stylet Placement Confirmation: ETT inserted through vocal cords under direct vision,  positive ETCO2 and breath sounds checked- equal and bilateral Secured at: 23 cm Tube secured with: Tape Dental Injury: Teeth and Oropharynx as per pre-operative assessment

## 2015-08-25 NOTE — Brief Op Note (Signed)
08/25/2015  11:38 AM  PATIENT:  Robert Beard  75 y.o. male  PRE-OPERATIVE DIAGNOSIS:  SPINAL STENOSIS   POST-OPERATIVE DIAGNOSIS:  SPINAL STENOSIS   PROCEDURE:  Procedure(s): L3-L5 DECOMPRESSION   (LEVEL 2) (N/A)  SURGEON:  Surgeon(s) and Role:    * Venita Lick, MD - Primary  PHYSICIAN ASSISTANT:   ASSISTANTS: none   ANESTHESIA:   general  EBL:  Total I/O In: 1500 [I.V.:1500] Out: 600 [Urine:200; Blood:400]  BLOOD ADMINISTERED:none  DRAINS: (1) Jackson-Pratt drain(s) with closed bulb suction in the back   LOCAL MEDICATIONS USED:  MARCAINE     SPECIMEN:  No Specimen  DISPOSITION OF SPECIMEN:  N/A  COUNTS:  YES  TOURNIQUET:  * No tourniquets in log *  DICTATION: .Other Dictation: Dictation Number [id number]  PLAN OF CARE: Admit to inpatient   PATIENT DISPOSITION:  PACU - hemodynamically stable.

## 2015-08-25 NOTE — H&P (Signed)
History of Present Illness  The patient is a 74 year old male who comes in today for a preoperative History and Physical. The patient is scheduled for a L3-5 Lumbar Decompression to be performed by Dr. Debria Garret D. Shon Baton, MD at [facility] on 08/25/2015 . Please see the hospital record for complete dictated history and physical.  Back pain is described as the following: symptoms in association with an established activity (raking leaves). and Symptoms include pain ("I feel better today, not quite as much pain, yesterday was painful"), paresthesias (bilateral legs, "somedays it's just the left leg") and pain in the calf (left), while symptoms do not include muscle spasm, decreased range of motion, numbness, burning, tingling, stiffness, tightness, weakness, pain with coughing/sneezing, incontinence of stool or incontinence of urine. The pain radiates to the none (he states "I am having a little burning when I urinate"). The patient describes the pain as dull ("it is there all the time"). The patient describes the severity of their symptoms as 3 / 10 on an analog pain scale. The patient feels as if the symptoms are unchanging. Symptoms are exacerbated by standing ("especially if I do those exercises"), while symptoms are not exacerbated by sitting, recumbency, lifting or bending. Symptoms are relieved by activity modification, nonsteroidal anti-inflammatory drugs and non-opioid analgesics. Current treatment includes non-opioid analgesics (Aspirin 81 qd). Pertinent medical history includes previous back injury (flare ups) and renal lithiasis, while pertinent medical history does not include herniated disc, sciatica, previous back surgery, osteoporosis, spinal stenosis, sacroiliac joint dysfunction, spondylolisthesis, fibromyalgia, depression, malignancy, chronic back pain, chronic pain, pacemaker or anti-coagulants. Prior to being seen today the patient was previously evaluated in this clinic. Past evaluation  has included MRI of the lumbar spine. Past treatment has included epidural injections (L5-S1 ESI).  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 TraMADol HCl (  Tablet, Oral) Active. Aleve (  Tablet, Oral) Active. Medications Reconciled  Vitals  08/23/2015 8:00 AM Weight: 210 lb Height: 69.5in Body Surface Area: 2.12 m Body Mass Index: 30.57 kg/m  Temp.: 97.50F  Pulse: 61 (Regular)  BP: 144/85 (Sitting, Left Arm, Standard)  General General Appearance-Not in acute distress. Orientation-Oriented X3. Build & Nutrition-Well nourished and Well developed.  Integumentary General Characteristics Surgical Scars - no surgical scar evidence of previous lumbar surgery. Lumbar Spine-Skin examination of the lumbar spine is without deformity, skin lesions, lacerations or abrasions.  Chest and Lung Exam Auscultation Breath sounds - Normal and Clear.  Cardiovascular Auscultation Rhythm - Regular rate and rhythm.  Abdomen Palpation/Percussion Palpation and Percussion of the abdomen reveal - Soft, Non Tender and No Rebound tenderness.  Peripheral Vascular Lower Extremity Palpation - Posterior tibial pulse - Bilateral - 2+. Dorsalis pedis pulse -  Bilateral - 2+.  Neurologic Sensation Lower Extremity - Bilateral - sensation is intact in the lower extremity. Reflexes Patellar  Reflex - Bilateral - 2+. Achilles Reflex - Bilateral - 2+. Clonus - Bilateral - clonus not present. Hoffman's Sign - Bilateral - Hoffman's sign not present. Testing Seated Straight Leg Raise - Bilateral - Seated straight leg raise negative.  Musculoskeletal Spine/Ribs/Pelvis  Lumbosacral Spine: Inspection and Palpation - Tenderness - left lumbar paraspinals tender to palpation and right lumbar paraspinals tender to palpation. Strength and Tone: Strength - Hip Flexion - Bilateral - 5/5. Knee Extension - Bilateral - 5/5. Knee Flexion - Bilateral - 5/5. Ankle Dorsiflexion - Bilateral - 5/5. Ankle Plantarflexion - Bilateral - 5/5. Heel walk - Bilateral - able to heel walk without difficulty. Toe Walk - Bilateral - able to walk on toes without difficulty. Heel-Toe Walk - Bilateral - able to heel-toe walk without difficulty. ROM - Flexion - mildly decreased range of motion. Extension - mildly decreased range of motion and painful. Left Lateral Bending - mildly decreased range of motion and painful. Right Lateral Bending - mildly decreased range of motion and painful. Right Rotation - mildly decreased range of motion and painful. Left Rotation - mildly decreased range of motion and painful. Pain - . Lumbosacral Spine - Waddell's Signs - no Waddell's signs present. Lower Extremity Range of Motion - No true hip, knee or ankle pain with range of motion. Gait and Station - [employer] - no assistive devices.  Plan: The patient is a 75 year old gentleman with underlying lumbar spinal stenosis with increasing neurogenic claudication pain. As a result of the failure of injection therapy and physical therapy, we have elected to move forward with a lumbar decompression. He has severe stenosis at 4-5 and moderate to significant stenosis at 3-4. I do think that by doing the posterior  lumbar decompression L3-5 that this would help alleviate his neurogenic claudication pain. Risks and benefits were reviewed which include infection, bleeding, nerve damage, death, stroke, paralysis, failure to heal, need for further surgery, ongoing or worse pain, loss of bowel or bladder control, recurrent spinal stenosis. We have obtained preoperative medical clearance.

## 2015-08-25 NOTE — Transfer of Care (Signed)
Immediate Anesthesia Transfer of Care Note  Patient: Robert Beard  Procedure(s) Performed: Procedure(s): L3-L5 DECOMPRESSION   (LEVEL 2) (N/A)  Patient Location: PACU  Anesthesia Type:General  Level of Consciousness: awake  Airway & Oxygen Therapy: Patient Spontanous Breathing and Patient connected to nasal cannula oxygen  Post-op Assessment: Report given to RN, Post -op Vital signs reviewed and stable and Patient moving all extremities X 4  Post vital signs: stable  Last Vitals:  Filed Vitals:   08/25/15 0723  BP: 155/91  Pulse: 57  Temp: 36.3 C  Resp: 20    Complications: No apparent anesthesia complications

## 2015-08-25 NOTE — Discharge Instructions (Signed)
Ok to restart ASA on Saturday 08/28/15

## 2015-08-25 NOTE — Progress Notes (Signed)
Sign out request made to dr edwards 

## 2015-08-25 NOTE — Op Note (Signed)
NAME:  RAD, GRAMLING NO.:  [id number]  MEDICAL RECORD NO.:  [id number]  LOCATION:  [facility]                         FACILITY:  [facility]  PHYSICIAN:  Dellar Traber D. Shon Baton, M.D. DATE OF BIRTH:  1941-04-23  DATE OF PROCEDURE: DATE OF DISCHARGE:                              OPERATIVE REPORT   PREOPERATIVE DIAGNOSIS:  Severe lumbar spinal stenosis, L3-4, L4-5.  POSTOPERATIVE DIAGNOSIS:  Severe lumbar spinal stenosis, L3-4, L4-5.  OPERATIVE PROCEDURE:  L3-5 decompression.  COMPLICATIONS:  None.  CONDITION:  Stable.  HISTORY:  This is a very pleasant 75 year old woman who has been having progressive debilitating back, buttock, bilateral leg pain consistent with neurogenic claudication.  Attempts at conservative management have failed to alleviate his symptoms and so we elected to proceed with surgery.  All appropriate risks, benefits, and alternatives were discussed with the patient and consent was obtained.  OPERATIVE NOTE:  The patient was brought to the operating room, placed supine on the operating table.  After successful induction of general anesthesia and endotracheal intubation, TEDs, SCDs, and Foley were inserted.  He was turned prone onto the Wilson frame and all bony prominences were well padded.  The back was then prepped and draped in a standard fashion.  After successful time-out confirming patient, procedure, and all other pertinent important data, Tuohy needles were placed in the back and an x-ray was taken and marked that incision site. Once this was done, I infiltrated the proposed incision site with 0.25% Marcaine with epinephrine.  I then made a midline incision starting at the superior aspect of L3 and proceeding to the inferior aspect of the L5 spinous process there of.  Sharp dissection was carried out down to the deep fascia.  The deep fascia was sharply incised and exposed the spinous process and lamina of L3 and L4 and a portion of that of L5.   I stripped out to the level of the facet capsule.  Care was taken not to violate the capsule.  Self-retaining retractors were placed into the wound and Penfield 4 was placed under the L4 inferior facet. Intraoperative x-ray confirmed satisfactory position.  I then removed the spinous process of L4 in its entirety and the superior portion of the L5 and the inferior portion of the L3.  I then used curved curette to develop a plane underneath the ligamentum flavum.  I then began using my 3 mm Kerrison punch to perform a generous central laminectomy.  Once I reached the insertion site of the ligamentum flavum on the undersurface of the 4 lamina, I used a Penfield 4 to dissect through the very thickened and buckled ligamentum flavum at the 4-5 level.  I eventually dissected through the central raphe.  I was able to use a 2 mm Kerrison punch to begin removing the ligamentum flavum from the central ridge and once I had a wide enough central decompression, I used my 3 mm Kerrison to continue the central decompression.  This noted significant compression of the thecal sac secondary to the thickened ligamentum flavum.  I continued creating plane between the thecal sac and the ligamentum flavum using a Penfield 4 and [facility] and once I safely created a plane,  used my 2 and 3 mm Kerrison punch to continue my decompression into the lateral recess.  Once in the lateral recess, I trimmed down the medial osteophytes from the L5 superior facet complex and was able to palpate the L5 pedicle, performed a foraminotomy of the L5 level, so that I could freely trace the L5 nerve root into the foramen below the L5 pedicle.  I then trimmed down the leading edge of the L5 lamina as well to complete my central decompression.  Once I had bilateral L5 foraminotomies and I had a lateral recess decompression out to the level of the pedicle of L5, I proceeded superiorly.  Again, I was stricken by the severity  of the stenosis secondary to the ligamentum flavum, this was consistent with what was seen on the MRI.  Using the neural patties, I created a plane and protected the thecal sac while I completed my L4 laminectomy.  Once the L4 laminectomy was completed, I then continued and performed a generous laminotomy of L3.  Based on the preoperative MRI, my goal was to get to just to the level of the pedicle.  This was where the maximum amount of stenosis at the 3-4 level was located.  Using the same technique, I had used the L4-5, I completed my lateral recess decompression and foraminotomy.  Once this was done, I could now take my [facility] and freely pass it out the L3 foramen into the 3-4 lateral recess, out the L4 foramen into the L4-5 lateral recess, and out the L5 foramen bilaterally.  I had excellent central and lateral recess and foraminal decompression from L3-L5.  The patient had significantly large epidural veins which I coagulated with bipolar electrocautery and used FloSeal and thrombin-soaked Gelfoam patty to obtain hemostasis.  I irrigated the wound copiously with normal saline, and then I elected to place a deep drain given the large epidural veins that were present and my concern for bleeding after he was mobilized.  I did place a thrombin-soaked Gelfoam patty over the exposed thecal sac and brought the drain out of a separate stab incision.  I then closed the deep fascia with a barbed running #1 Vicryl suture, superficial with a barbed running 2-0 Vicryl suture, and the skin with a barbed running 3- 0 Monocryl stitch.  Steri-Strips and dry dressing were applied and the patient was ultimately extubated and transferred to the PACU in no acute distress.  At the end of the case, all needle and sponge counts were correct and there were no adverse intraoperative events.     Precious Segall D. Shon Baton, M.D.     DDB/MEDQ  D:  08/25/2015  T:  08/25/2015  Job:  [id number]  cc:   Debria Garret D.  Shon Baton, M.D.'s Office

## 2015-08-26 ENCOUNTER — Encounter (HOSPITAL_COMMUNITY): Payer: Self-pay | Admitting: Orthopedic Surgery

## 2015-08-26 MED ORDER — MAGNESIUM CITRATE PO SOLN
1.0000 | Freq: Once | ORAL | Status: DC
  Filled 2015-08-26: qty 296

## 2015-08-26 MED FILL — Thrombin For Soln 20000 Unit: CUTANEOUS | Qty: 1 | Status: AC

## 2015-08-26 NOTE — Progress Notes (Signed)
Pt doing well. Pt and wife given D/C instructions with Rx's, verbal understanding was provided. Pt's JP was removed by PA. Pt's incision is clean and dry with no sign of infection. Pt's IV was removed prior to D/C. Pt D/C'd home via wheelchair @ 1500 per MD order. Pt is stable @ D/C and has no other needs at this time. Rema Fendt, RN

## 2015-08-26 NOTE — Progress Notes (Signed)
Physical Therapy Treatment Patient Details Name: Robert Beard MRN: [id number] DOB: 1941-05-25 Today's Date: 08/26/2015    History of Present Illness Pt is a 75 y/o M s/p L3-5 decompression.  Pt's PMH includes blood clots, knee surgery.    PT Comments    Pt progressing towards physical therapy goals. Pt was educated on back precautions and reinforced safety with mobility. Pt occasionally appears unsteady however is able to recover without assistance. Will keep on acute PT caseload until d/c to maximize functional independence prior to return home.   Follow Up Recommendations  No PT follow up;Supervision for mobility/OOB     Equipment Recommendations  None recommended by PT    Recommendations for Other Services       Precautions / Restrictions Precautions Precautions: Fall;Back Precaution Booklet Issued: Yes (comment) Precaution Comments: Reviewed back precautions w/ pt and family Required Braces or Orthoses: Spinal Brace Spinal Brace: Lumbar corset;Applied in sitting position Restrictions Weight Bearing Restrictions: No    Mobility  Bed Mobility Overal bed mobility: Needs Assistance Bed Mobility: Rolling;Sidelying to Sit Rolling: Modified independent (Device/Increase time) Sidelying to sit: Supervision     Sit to sidelying: Modified independent (Device/Increase time) General bed mobility comments: Cues for log roll technique.  No physical assist needed.  Transfers Overall transfer level: Modified independent Equipment used: None Transfers: Sit to/from Stand Sit to Stand: Modified independent (Device/Increase time)         General transfer comment: Pt w/ safe technique and very min instability noted. No assist required to recover.   Ambulation/Gait Ambulation/Gait assistance: Supervision Ambulation Distance (Feet): 400 Feet Assistive device: None Gait Pattern/deviations: Step-through pattern;Decreased stride length;Trunk flexed Gait velocity: Decreased Gait  velocity interpretation: Below normal speed for age/gender General Gait Details: No assist required. No LOB noted.    Stairs            Wheelchair Mobility    Modified Rankin (Stroke Patients Only)       Balance Overall balance assessment: Needs assistance Sitting-balance support: Feet supported;No upper extremity supported Sitting balance-Leahy Scale: Good     Standing balance support: No upper extremity supported;During functional activity Standing balance-Leahy Scale: Fair                      Cognition Arousal/Alertness: Awake/alert Behavior During Therapy: WFL for tasks assessed/performed Overall Cognitive Status: Within Functional Limits for tasks assessed                      Exercises      General Comments        Pertinent Vitals/Pain Pain Assessment: Faces Pain Score: 1  Faces Pain Scale: Hurts a little bit Pain Location: Back Pain Descriptors / Indicators: Operative site guarding;Aching Pain Intervention(s): Limited activity within patient's tolerance;Monitored during session;Repositioned    Home Living Family/patient expects to be discharged to:: Private residence Living Arrangements: Spouse/significant other Available Help at Discharge: Family;Available 24 hours/day Type of Home: House Home Access: Level entry   Home Layout: One level Home Equipment: Walker - 2 wheels;Cane - single point;Grab bars - toilet;Hand held shower head      Prior Function Level of Independence: Independent          PT Goals (current goals can now be found in the care plan section) Acute Rehab PT Goals Patient Stated Goal: to go home and to eventually get back to hiking PT Goal Formulation: With patient/family Time For Goal Achievement: 09/01/15 Potential to Achieve Goals: Good Progress towards  PT goals: Progressing toward goals    Frequency  Min 5X/week    PT Plan Current plan remains appropriate    Co-evaluation             End  of Session Equipment Utilized During Treatment: Back brace Activity Tolerance: Patient tolerated treatment well Patient left: in chair;with call bell/phone within reach;with family/visitor present     Time: 1610-9604 PT Time Calculation (min) (ACUTE ONLY): 19 min  Charges:  $Gait Training: 8-22 mins                    G Codes:      Conni Slipper 2015/09/23, 10:36 AM   Conni Slipper, PT, DPT Acute Rehabilitation Services Pager: [phone]

## 2015-08-26 NOTE — Evaluation (Signed)
Occupational Therapy Evaluation and Discharge Patient Details Name: Shoji Pertuit MRN: [id number] DOB: 1941/04/04 Today's Date: 08/26/2015    History of Present Illness Pt is a 75 y/o M s/p L3-5 decompression.  Pt's PMH includes blood clots, knee surgery.   Clinical Impression   This 75 yo male admitted and underwent above presents to acute OT with all education completed with pt and his wife. No further OT needs we will sign off.    Follow Up Recommendations  No OT follow up    Equipment Recommendations  None recommended by OT       Precautions / Restrictions Precautions Precautions: Fall;Back Precaution Comments: Pt and wife able to state 3/3 back precautions Required Braces or Orthoses: Spinal Brace Spinal Brace: Lumbar corset;Applied in sitting position Restrictions Weight Bearing Restrictions: No      Mobility Bed Mobility Overal bed mobility: Modified Independent Bed Mobility: Rolling;Sidelying to Sit;Sit to Sidelying Rolling: Modified independent (Device/Increase time) (no rail, HOB up due to pt has an adjustable bed) Sidelying to sit: Modified independent (Device/Increase time)     Sit to sidelying: Modified independent (Device/Increase time)    Transfers Overall transfer level: Modified independent Equipment used: None Transfers: Sit to/from Stand Sit to Stand: Modified independent (Device/Increase time)                   ADL Overall ADL's : Modified independent                                       General ADL Comments: Educated pt and wife on most efficient way to get dressed, to use a rinse and spit cup for brushing teeth to avoid bending over the sink, can get no rinse shampoo or shampoo cap to wash hair since pt says he has been told not to get in shower for 5 days, use wet wipes for back peri care to avoid twisiting               Pertinent Vitals/Pain Pain Assessment: 0-10 Pain Score: 1  Pain Location: back Pain  Descriptors / Indicators: Aching Pain Intervention(s): Monitored during session;Repositioned     Hand Dominance Right   Extremity/Trunk Assessment Upper Extremity Assessment Upper Extremity Assessment: Overall WFL for tasks assessed           Communication Communication Communication: No difficulties   Cognition Arousal/Alertness: Awake/alert Behavior During Therapy: WFL for tasks assessed/performed Overall Cognitive Status: Within Functional Limits for tasks assessed                                Home Living Family/patient expects to be discharged to:: Private residence Living Arrangements: Spouse/significant other Available Help at Discharge: Family;Available 24 hours/day Type of Home: House Home Access: Level entry     Home Layout: One level     Bathroom Shower/Tub: Walk-in shower;Door   Bathroom Toilet: Handicapped height     Home Equipment: Environmental consultant - 2 wheels;Cane - single point;Grab bars - toilet;Hand held shower head          Prior Functioning/Environment Level of Independence: Independent             OT Diagnosis: Generalized weakness;Acute pain         OT Goals(Current goals can be found in the care plan section) Acute Rehab OT Goals Patient Stated Goal: home later today  OT Frequency:                End of Session Equipment Utilized During Treatment: Back brace  Activity Tolerance: Patient tolerated treatment well Patient left: in bed;with call bell/phone within reach;with family/visitor present   Time: 1610-9604 OT Time Calculation (min): 24 min Charges:  OT General Charges $OT Visit: 1 Procedure OT Evaluation $OT Eval Moderate Complexity: 1 Procedure OT Treatments $Self Care/Home Management : 8-22 mins  Evette Georges [phone] 08/26/2015, 9:58 AM

## 2015-08-26 NOTE — Progress Notes (Signed)
Patient ID: Robert Beard, male   DOB: [birth year], 75 y.o.   MRN: [id number]    Subjective: 1 Day Post-Op Procedure(s) (LRB): L3-L5 DECOMPRESSION   (LEVEL 2) (N/A) Patient reports pain as 2 on 0-10 scale.  When laying down increases with ambulation Denies CP or SOB.  Voiding without difficulty. (foley pulled at 4am) Positive flatus. Objective: Vital signs in last 24 hours: Temp:  [97.4 F (36.3 C)-99.1 F (37.3 C)] 98 F (36.7 C) (02/09 0400) Pulse Rate:  [57-110] 97 (02/09 0400) Resp:  [14-21] 18 (02/09 0400) BP: (109-155)/(68-91) 114/84 mmHg (02/09 0400) SpO2:  [94 %-98 %] 95 % (02/09 0400) Weight:  [92.987 kg (205 lb)] 92.987 kg (205 lb) (02/08 0723)  Intake/Output from previous day: 02/08 0701 - 02/09 0700 In: 2180 [P.O.:480; I.V.:1700] Out: 2100 [Urine:1475; Drains:225; Blood:400] Intake/Output this shift:    Labs: No results for input(s): HGB in the last 72 hours. No results for input(s): WBC, RBC, HCT, PLT in the last 72 hours. No results for input(s): NA, K, CL, CO2, BUN, CREATININE, GLUCOSE, CALCIUM in the last 72 hours. No results for input(s): LABPT, INR in the last 72 hours.  Physical Exam: Neurologically intact ABD soft Sensation intact distally Dorsiflexion/Plantar flexion intact Incision: dressing C/D/I Compartment soft  Assessment/Plan: 1 Day Post-Op Procedure(s) (LRB): L3-L5 DECOMPRESSION   (LEVEL 2) (N/A) Up with therapy - pt has been walking unassisted IV discontinued Foley Discontinued Ordered mag Citrate Consider DC today - after cleared by PT and BM  Travarius Lange, Baxter Kail for Dr. Venita Lick [facility] [phone] 08/26/2015, 7:07 AM

## 2015-09-03 NOTE — Discharge Summary (Signed)
Physician Discharge Summary  Patient ID: Robert Beard MRN: [id number] DOB/AGE: 10-23-1940 75 y.o.  Admit date: 08/25/2015 Discharge date: 09/03/2015  Admission Diagnoses:  Spinal stenosis and radicular LE pain  Discharge Diagnoses:  Active Problems:   Spinal stenosis   Past Medical History  Diagnosis Date  . GERD (gastroesophageal reflux disease)   . Kidney stone   . History of blood clots     in leg after knee surgery  . Heart palpitations   . Hard of hearing   . Wears glasses     Surgeries: Procedure(s): L3-L5 DECOMPRESSION   (LEVEL 2) on 08/25/2015   Consultants (if any):    Discharged Condition: Improved  Hospital Course: Robert Beard is an 75 y.o. male who was admitted 08/25/2015 with a diagnosis of Spinal Stenosis and LE radiculopathy and went to the operating room on 08/25/2015 and underwent the above named procedures.  The pt was discharged on 08/26/15.  Hospital course was uneventful.   He was given perioperative antibiotics:  Anti-infectives    Start     Dose/Rate Route Frequency Ordered Stop   08/25/15 1600  ceFAZolin (ANCEF) IVPB 1 g/50 mL premix     1 g 100 mL/hr over 30 Minutes Intravenous Every 8 hours 08/25/15 1248 08/26/15 0011   08/25/15 0800  ceFAZolin (ANCEF) IVPB 2 g/50 mL premix     2 g 100 mL/hr over 30 Minutes Intravenous To [facility] 08/24/15 1107 08/25/15 0835    .  He was given sequential compression devices, early ambulation, and TED for DVT prophylaxis.  He benefited maximally from the hospital stay and there were no complications.    Recent vital signs:  Filed Vitals:   08/26/15 0810 08/26/15 1220  BP: 126/58 120/71  Pulse: 89 78  Temp: 98 F (36.7 C) 98 F (36.7 C)  Resp: 18 18    Recent laboratory studies:  Lab Results  Component Value Date   HGB 16.5 08/19/2015   HGB 14.0 06/11/2014   Lab Results  Component Value Date   WBC 6.3 08/19/2015   PLT 199 08/19/2015   Lab Results  Component Value Date   INR 0.97  06/11/2014   Lab Results  Component Value Date   NA 140 08/19/2015   K 4.3 08/19/2015   CL 105 08/19/2015   CO2 23 08/19/2015   BUN 14 08/19/2015   CREATININE 0.72 08/19/2015   GLUCOSE 87 08/19/2015    Discharge Medications:     Medication List    STOP taking these medications        aspirin EC 81 MG tablet     lansoprazole 30 MG capsule  Commonly known as:  PREVACID     naphazoline-glycerin 0.012-0.2 % Soln  Commonly known as:  CLEAR EYES     PHILLIPS STOOL SOFTENER 100 MG capsule  Generic drug:  docusate sodium     SUPER ENERGY HERBAL COMPLEX PO     Vitamin D3 2000 units capsule     XARELTO STARTER PACK 15 & 20 MG Tbpk  Generic drug:  Rivaroxaban      TAKE these medications        methocarbamol 500 MG tablet  Commonly known as:  ROBAXIN  Take 1 tablet (500 mg total) by mouth 3 (three) times daily as needed for muscle spasms.     ondansetron 4 MG tablet  Commonly known as:  ZOFRAN  Take 1 tablet (4 mg total) by mouth every 8 (eight) hours as needed for nausea or  vomiting.     oxyCODONE-acetaminophen 10-325 MG tablet  Commonly known as:  PERCOCET  Take 1 tablet by mouth every 4 (four) hours as needed for pain.        Diagnostic Studies: Dg Lumbar Spine 2-3 Views  08/25/2015  CLINICAL DATA:  L3-3 5 lumbar decompression EXAM: LUMBAR SPINE - 2-3 VIEW COMPARISON:  None. FINDINGS: Two cross-table lateral lumbar images were obtained. On image labeled #1, there is a metallic probe with tip posterior to the superior aspect of the L4 vertebral body. A second probe has its tip posterior to the L2 vertebral body superiorly. On image labeled #2, there is a metallic probe posterior to the midportion of the L5 vertebral body. There is mild disc space narrowing at L4-5 and L5-S1. There is also moderate disc space narrowing at L1-2. No fracture or spondylolisthesis. IMPRESSION: On the second submitted image, metallic probe tip is posterior to the midportion of the L5 vertebral  body. There is disc space narrowing at L4-5 L5-S1. No fracture or spondylolisthesis. There is also moderate disc space narrowing at L1-2. Electronically Signed   By: Bretta Bang III M.D.   On: 08/25/2015 09:26    Disposition: 01-Home or Self Care        Follow-up Information    Follow up with Alvy Beal, MD. Schedule an appointment as soon as possible for a visit in 2 weeks.   Specialty:  Orthopedic Surgery   Why:  If symptoms worsen, For suture removal, For wound re-check   Contact information:   [address] [place] [address] [phone]        Signed: Kirt Boys 09/03/2015, 10:46 AM

## 2017-08-16 IMAGING — CR DG LUMBAR SPINE 2-3V
1 series · 1 of 1 positions shown · non-contrast
Comparison: None.

CLINICAL DATA: L3-3 5 lumbar decompression

EXAM:
LUMBAR SPINE - 2-3 VIEW

[xtable lateral]
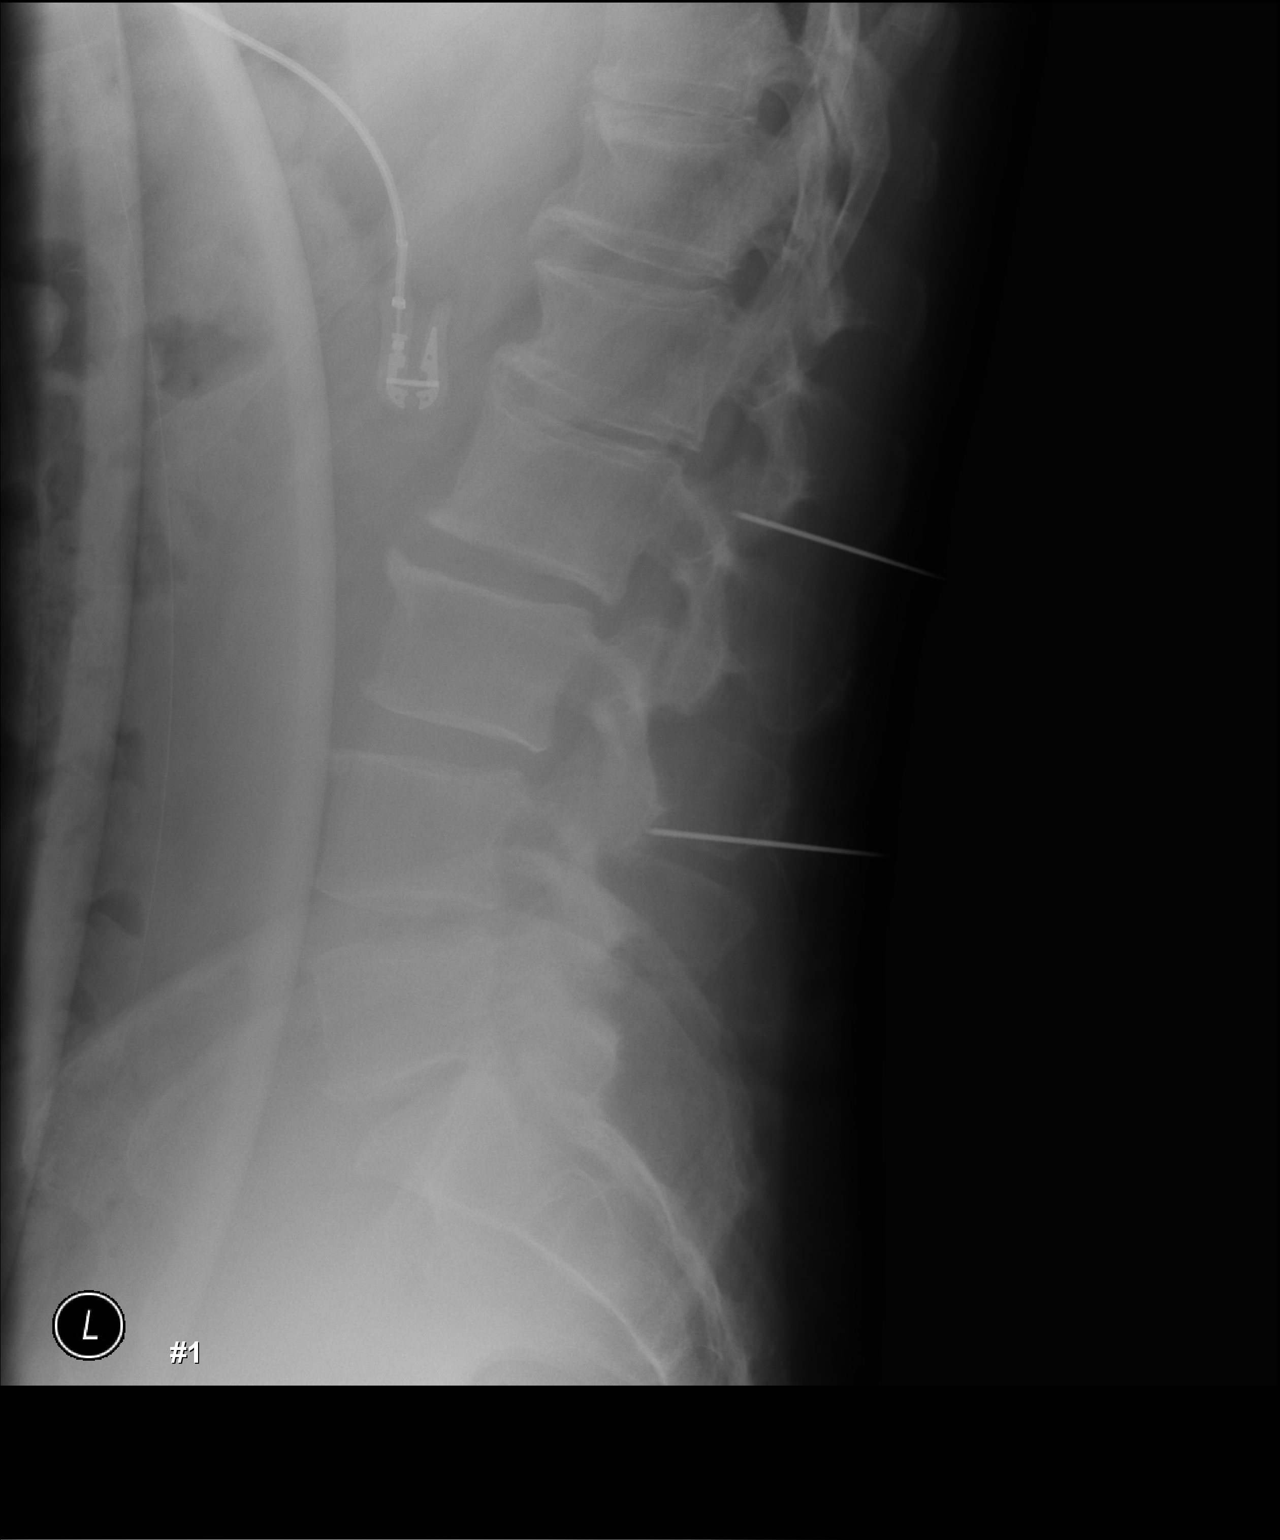

[1 of 1 positions shown; findings below may reference images not displayed]

FINDINGS: Two cross-table lateral lumbar images were obtained. On image
labeled #1, there is a metallic probe with tip posterior to the
superior aspect of the L4 vertebral body. A second probe has its tip
posterior to the L2 vertebral body superiorly. On image labeled #2,
there is a metallic probe posterior to the midportion of the L5
vertebral body. There is mild disc space narrowing at L4-5 and
L5-S1. There is also moderate disc space narrowing at L1-2. No
fracture or spondylolisthesis.
IMPRESSION: On the second submitted image, metallic probe tip is posterior to
the midportion of the L5 vertebral body. There is disc space
narrowing at L4-5 L5-S1. No fracture or spondylolisthesis. There is
also moderate disc space narrowing at L1-2.
